# Patient Record
Sex: Female | Born: 1985 | Race: Black or African American | Hispanic: No | Marital: Single | Smoking: Never smoker
Health system: Southern US, Community
[De-identification: ages and names within clinical notes are randomized; demographics above are authoritative.]

## PROBLEM LIST (undated history)

## (undated) DIAGNOSIS — D571 Sickle-cell disease without crisis: Secondary | ICD-10-CM

---

## 2013-12-31 ENCOUNTER — Emergency Department (HOSPITAL_BASED_OUTPATIENT_CLINIC_OR_DEPARTMENT_OTHER)
Admission: EM | Admit: 2013-12-31 | Discharge: 2013-12-31 | Disposition: A | Discharge status: Home / Self Care | Payer: Self-pay | Attending: Emergency Medicine | Admitting: Emergency Medicine

## 2013-12-31 ENCOUNTER — Encounter (HOSPITAL_BASED_OUTPATIENT_CLINIC_OR_DEPARTMENT_OTHER): Payer: Self-pay | Admitting: Emergency Medicine

## 2013-12-31 ENCOUNTER — Emergency Department (HOSPITAL_BASED_OUTPATIENT_CLINIC_OR_DEPARTMENT_OTHER): Payer: Self-pay

## 2013-12-31 DIAGNOSIS — S6990XA Unspecified injury of unspecified wrist, hand and finger(s), initial encounter: Principal | ICD-10-CM | POA: Insufficient documentation

## 2013-12-31 DIAGNOSIS — S6980XA Other specified injuries of unspecified wrist, hand and finger(s), initial encounter: Secondary | ICD-10-CM | POA: Insufficient documentation

## 2013-12-31 DIAGNOSIS — Y929 Unspecified place or not applicable: Secondary | ICD-10-CM | POA: Insufficient documentation

## 2013-12-31 DIAGNOSIS — Y939 Activity, unspecified: Secondary | ICD-10-CM | POA: Insufficient documentation

## 2013-12-31 DIAGNOSIS — W208XXA Other cause of strike by thrown, projected or falling object, initial encounter: Secondary | ICD-10-CM | POA: Insufficient documentation

## 2013-12-31 DIAGNOSIS — Z862 Personal history of diseases of the blood and blood-forming organs and certain disorders involving the immune mechanism: Secondary | ICD-10-CM | POA: Insufficient documentation

## 2013-12-31 DIAGNOSIS — S6991XA Unspecified injury of right wrist, hand and finger(s), initial encounter: Secondary | ICD-10-CM

## 2013-12-31 HISTORY — DX: Sickle-cell disease without crisis: D57.1

## 2013-12-31 NOTE — Discharge Instructions (Signed)
Contusion °A contusion is a deep bruise. Contusions happen when an injury causes bleeding under the skin. Signs of bruising include pain, puffiness (swelling), and discolored skin. The contusion may turn blue, purple, or yellow. °HOME CARE  °· Put ice on the injured area. °¨ Put ice in a plastic bag. °¨ Place a towel between your skin and the bag. °¨ Leave the ice on for 15-20 minutes, 03-04 times a day. °· Only take medicine as told by your doctor. °· Rest the injured area. °· If possible, raise (elevate) the injured area to lessen puffiness. °GET HELP RIGHT AWAY IF:  °· You have more bruising or puffiness. °· You have pain that is getting worse. °· Your puffiness or pain is not helped by medicine. °MAKE SURE YOU:  °· Understand these instructions. °· Will watch your condition. °· Will get help right away if you are not doing well or get worse. °Document Released: 11/17/2007 Document Revised: 08/23/2011 Document Reviewed: 04/05/2011 °Exit[employer]® Patient Information ©2015 [employer]. This information is not intended to replace advice given to you by your health care provider. Make sure you discuss any questions you have with your health care provider. ° °

## 2013-12-31 NOTE — ED Notes (Signed)
Injury to right middle finger that occurred yesterday while trying to retrieve weeds from the lawn mower.

## 2013-12-31 NOTE — ED Notes (Addendum)
Patient states hurt finger on lawnmower yesterday. Nail bed is broken off at tip of finger, patient states bleeding there was stopped after initial injury. Patient feels finger bone may be broken, she has improvised splint in place made from cardboard and string.

## 2013-12-31 NOTE — ED Notes (Signed)
Finger splint applied to right middle finger.  Good circulation, good sensation.  Splint intact but able to move hand.

## 2013-12-31 NOTE — ED Provider Notes (Signed)
CSN: [id number]     Arrival date & time 12/31/13  [id number] History   First MD Initiated Contact with Patient 12/31/13 [phone]     Chief Complaint  Patient presents with  . Finger Injury     (Consider location/radiation/quality/duration/timing/severity/associated sxs/prior Treatment) HPI  28 y.o. Female complaining of injury to right middle finger yesterday from lawn mower falling on it.  The nail was cut off but no laceration to finger.  She states the entire finger hurts. No other injury.  NO numbness, tingling.   Past Medical History  Diagnosis Date  . Sickle cell anemia    Past Surgical History  Procedure Laterality Date  . Cesarean section     No family history on file. History  Substance Use Topics  . Smoking status: Never Smoker   . Smokeless tobacco: Not on file  . Alcohol Use: No   OB History   Grav Para Term Preterm Abortions TAB SAB Ect Mult Living                 Review of Systems  Musculoskeletal: Negative.   Skin: Negative.       Allergies  Review of patient's allergies indicates no known allergies.  Home Medications   Prior to Admission medications   Medication Sig Start Date End Date Taking? Authorizing Provider  ibuprofen (ADVIL,MOTRIN) 800 MG tablet Take 800 mg by mouth every 8 (eight) hours as needed.   Yes Historical Provider, MD   BP 113/81  Pulse 61  Temp(Src) 98.2 F (36.8 C) (Oral)  Resp 14  SpO2 100%  LMP 12/03/2013 Physical Exam  Nursing note and vitals reviewed. Constitutional: She appears well-developed and well-nourished.  HENT:  Head: Normocephalic and atraumatic.  Nose: Nose normal.  Neck: Normal range of motion.  Cardiovascular: Normal rate.   Pulmonary/Chest: Effort normal.  Musculoskeletal:       Right hand: She exhibits decreased range of motion, tenderness and swelling. She exhibits normal two-point discrimination, normal capillary refill, no deformity and no laceration.       Hands:   ED Course  Procedures (including  critical care time) Labs Review Labs Reviewed - No data to display  Imaging Review Dg Finger Middle Right  12/31/2013   CLINICAL DATA:  Blow to the right long finger 1 day ago.  Pain.  EXAM: RIGHT MIDDLE FINGER 2+V  COMPARISON:  None.  FINDINGS: Imaged bones, joints and soft tissues appear normal.  IMPRESSION: Negative exam.   Electronically Signed   By: Drusilla Kannerhomas  Dalessio M.D.   On: 12/31/2013 07:19     EKG Interpretation None      MDM   Final diagnoses:  Finger injury, right, initial encounter    28 y.o. Female with injury to right middle finger- no skin disruption, mild diffuse swelling- radiology study negative for fracture.  Plan splint and referral for recheck to hand surgeon.     Hilario Quarryanielle S Anastasiya Gowin, MD 12/31/13 [phone]

## 2014-01-22 ENCOUNTER — Encounter (HOSPITAL_BASED_OUTPATIENT_CLINIC_OR_DEPARTMENT_OTHER): Payer: Self-pay | Admitting: Emergency Medicine

## 2014-01-22 ENCOUNTER — Emergency Department (HOSPITAL_BASED_OUTPATIENT_CLINIC_OR_DEPARTMENT_OTHER)
Admission: EM | Admit: 2014-01-22 | Discharge: 2014-01-22 | Disposition: A | Discharge status: Home / Self Care | Payer: Medicaid Other | Attending: Emergency Medicine | Admitting: Emergency Medicine

## 2014-01-22 DIAGNOSIS — Z5189 Encounter for other specified aftercare: Secondary | ICD-10-CM | POA: Insufficient documentation

## 2014-01-22 DIAGNOSIS — Z862 Personal history of diseases of the blood and blood-forming organs and certain disorders involving the immune mechanism: Secondary | ICD-10-CM | POA: Insufficient documentation

## 2014-01-22 DIAGNOSIS — S6991XD Unspecified injury of right wrist, hand and finger(s), subsequent encounter: Secondary | ICD-10-CM

## 2014-01-22 NOTE — ED Notes (Addendum)
Pt with finger injury 7/19-seen here 7/20-was to f/u with ortho-did not f/u-requesting RTW note

## 2014-01-22 NOTE — ED Provider Notes (Signed)
CSN: [id number]     Arrival date & time 01/22/14  1639 History   First MD Initiated Contact with Patient 01/22/14 1834     This chart was scribed for Ethelda ChickMartha K Linker, MD by Arlan OrganAshley Leger, ED Scribe. This patient was seen in room MHT13/MHT13 and the patient's care was started 7:05 PM.   Chief Complaint  Patient presents with  . Follow-up   HPI  HPI Comments: Paige AngLatasha Huynh is a 28 y.o. female with a PMHx of sickle cell anemia who presents to the Emergency Department here for follow up today. Pt was seen 7/20 for a finger injury after a lawn mower fell on her R 3rd finger. X-Ray was performed without any abnormal acute findings. Pt was advised to follow with hand surgeon at discharge. States she attempted to see othro, however, at time of visit, she did not have her medicaid card on hand and was unable to be seen. She was given a new appointment 3 weeks out but declined. Pt states pain has now completely subsided. She is requesting a note to be taken off of light duty at work today. No known allergies to medications. No other concerns this visit.  Past Medical History  Diagnosis Date  . Sickle cell anemia    Past Surgical History  Procedure Laterality Date  . Cesarean section     No family history on file. History  Substance Use Topics  . Smoking status: Never Smoker   . Smokeless tobacco: Not on file  . Alcohol Use: No   OB History   Grav Para Term Preterm Abortions TAB SAB Ect Mult Living                 Review of Systems  Constitutional: Negative for fever and chills.  Musculoskeletal: Negative for arthralgias.  Neurological: Negative for weakness and numbness.      Allergies  Review of patient's allergies indicates no known allergies.  Home Medications   Prior to Admission medications   Medication Sig Start Date End Date Taking? Authorizing Provider  ibuprofen (ADVIL,MOTRIN) 800 MG tablet Take 800 mg by mouth every 8 (eight) hours as needed.    Historical Provider, MD    Triage Vitals: BP 115/71  Pulse 51  Temp(Src) 98.1 F (36.7 C) (Oral)  Resp 16  Ht 4\' 11"  (1.499 m)  Wt 105 lb (47.628 kg)  BMI 21.20 kg/m2  SpO2 100%  LMP 01/20/2014   Physical Exam  Nursing note and vitals reviewed. Constitutional: She is oriented to person, place, and time. She appears well-developed and well-nourished.  HENT:  Head: Normocephalic.  Eyes: EOM are normal.  Neck: Normal range of motion.  Pulmonary/Chest: Effort normal.  Abdominal: She exhibits no distension.  Musculoskeletal: Normal range of motion.  Neurological: She is alert and oriented to person, place, and time.  Psychiatric: She has a normal mood and affect.  Note- fingers with no ttp, no pain on ROM, no abrasions or breaks in skin  ED Course  Procedures (including critical care time)  DIAGNOSTIC STUDIES: Oxygen Saturation is 100% on RA, Normal by my interpretation.    COORDINATION OF CARE: 7:06 PM-Discussed treatment plan with pt at bedside and pt agreed to plan.     Labs Review Labs Reviewed - No data to display  Imaging Review No results found.   EKG Interpretation None      MDM   Final diagnoses:  Finger injury, right, subsequent encounter    Pt presenting after resolution of finger  injury, she was not able to get followup, she is requesting return to work note.  Hand/finger exam normal. xrays reviewed from prior and no acute abnormalities.  Discharged with strict return precautions.  Pt agreeable with plan.  Nursing notes including past medical history and social history reviewed and considered in documentation Prior records reviewed and considered during this visit   I personally performed the services described in this documentation, which was scribed in my presence. The recorded information has been reviewed and is accurate.    Ethelda Chick, MD 01/26/14 [phone]

## 2014-01-22 NOTE — ED Notes (Signed)
Patient states she attempted to be seen by the Ortho MD, who could not find her medicaid card and would not see her.

## 2014-01-22 NOTE — Discharge Instructions (Signed)
Return to the ED with any concerns including increased pain, numbness/swelling/discoloration of finger

## 2014-07-16 ENCOUNTER — Emergency Department (HOSPITAL_COMMUNITY)
Admission: EM | Admit: 2014-07-16 | Discharge: 2014-07-16 | Disposition: A | Discharge status: Home / Self Care | Payer: Medicaid Other | Attending: Emergency Medicine | Admitting: Emergency Medicine

## 2014-07-16 ENCOUNTER — Encounter (HOSPITAL_COMMUNITY): Payer: Self-pay | Admitting: Adult Health

## 2014-07-16 ENCOUNTER — Emergency Department (HOSPITAL_COMMUNITY): Payer: Medicaid Other

## 2014-07-16 DIAGNOSIS — R059 Cough, unspecified: Secondary | ICD-10-CM

## 2014-07-16 DIAGNOSIS — J029 Acute pharyngitis, unspecified: Secondary | ICD-10-CM

## 2014-07-16 DIAGNOSIS — J069 Acute upper respiratory infection, unspecified: Secondary | ICD-10-CM | POA: Insufficient documentation

## 2014-07-16 DIAGNOSIS — Z862 Personal history of diseases of the blood and blood-forming organs and certain disorders involving the immune mechanism: Secondary | ICD-10-CM | POA: Insufficient documentation

## 2014-07-16 DIAGNOSIS — R05 Cough: Secondary | ICD-10-CM

## 2014-07-16 LAB — RAPID STREP SCREEN (MED CTR MEBANE ONLY): Streptococcus, Group A Screen (Direct): NEGATIVE

## 2014-07-16 MED ORDER — HYDROCODONE-HOMATROPINE 5-1.5 MG/5ML PO SYRP
5.0000 mL | ORAL_SOLUTION | Freq: Four times a day (QID) | ORAL | Status: AC | PRN
Start: 2014-07-16 — End: ?

## 2014-07-16 NOTE — ED Notes (Signed)
Pt escorted to front entrance.  

## 2014-07-16 NOTE — ED Notes (Signed)
Pt reports fever, sore throat, cough, and body aches for a week. Pt states she has been taking otc medicine with minimum relief. Pt denies any one else in the house being sick. Pt rates pain 7/10 in throat.

## 2014-07-16 NOTE — ED Notes (Signed)
Presents with one week of sore throat and cough and slight fever of 100.6 at home. VSS, throat red, cough producitve with yellow sputum.

## 2014-07-16 NOTE — ED Provider Notes (Signed)
CSN: [id number]     Arrival date & time 07/16/14  1841 History  This chart was scribed for non-physician practitioner, Roxy Horsemanobert Riki Berninger, PA-C working with Gerhard Munchobert Lockwood, MD by Luisa DagoPriscilla Tutu, ED scribe. This patient was seen in room TR05C/TR05C and the patient's care was started at 7:16 PM.    Chief Complaint  Patient presents with  . Sore Throat  . Fever   The history is provided by the patient and medical records. No language interpreter was used.   HPI Comments: Paige Huynh is a 29 y.o. female with a PMhx of sickle cell anemia listed below presents to the Emergency Department with a chief complaint of sudden onset worsening sore throat that started 1 week ago. Pt is also complaining of associated generalized myalgias and cough. Endorses a low grade fever with a measured TMAX of 100.6, current ED temperature is 98.6. She reports taking Tylenol, Ibuprofen, and other OTC medication. Denies any nausea, tobacco usage, emesis, abdominal pain, SOB, weakness, or numbness.   Past Medical History  Diagnosis Date  . Sickle cell anemia    Past Surgical History  Procedure Laterality Date  . Cesarean section     No family history on file. History  Substance Use Topics  . Smoking status: Never Smoker   . Smokeless tobacco: Not on file  . Alcohol Use: No   OB History    No data available     Review of Systems  Constitutional: Positive for fever. Negative for chills.  HENT: Positive for congestion, rhinorrhea and sore throat. Negative for ear pain, sinus pressure, trouble swallowing and voice change.   Eyes: Negative for discharge.  Respiratory: Positive for cough. Negative for shortness of breath, wheezing and stridor.   Cardiovascular: Negative for chest pain.  Gastrointestinal: Negative for abdominal pain.  Genitourinary: Negative.       Allergies  Review of patient's allergies indicates no known allergies.  Home Medications   Prior to Admission medications   Medication Sig  Start Date End Date Taking? Authorizing Provider  ibuprofen (ADVIL,MOTRIN) 800 MG tablet Take 800 mg by mouth every 8 (eight) hours as needed.    Historical Provider, MD   BP 123/90 mmHg  Pulse 77  Temp(Src) 98.6 F (37 C) (Oral)  Resp 18  Ht 4\' 11"  (1.499 m)  Wt 105 lb (47.628 kg)  BMI 21.20 kg/m2  SpO2 100%  Physical Exam  Constitutional: She is oriented to person, place, and time. She appears well-developed and well-nourished. No distress.  HENT:  Head: Normocephalic and atraumatic.  Right Ear: External ear normal.  Left Ear: External ear normal.  Mildly erythematous, no tonsillar exudate, no abscess, no stridor, uvula is midline  TMs clear bilaterally  Eyes: Conjunctivae and EOM are normal. Pupils are equal, round, and reactive to light.  Neck: Normal range of motion. Neck supple. No tracheal deviation present.  Cardiovascular: Normal rate, regular rhythm and normal heart sounds.  Exam reveals no gallop and no friction rub.   No murmur heard. Pulmonary/Chest: Effort normal and breath sounds normal. No stridor. No respiratory distress. She has no wheezes. She has no rales. She exhibits no tenderness.  CTAB  Abdominal: Soft. Bowel sounds are normal. She exhibits no distension. There is no tenderness.  Musculoskeletal: Normal range of motion. She exhibits no tenderness.  Neurological: She is alert and oriented to person, place, and time.  Skin: Skin is warm and dry. No rash noted. She is not diaphoretic.  Psychiatric: She has a normal mood and  affect. Her behavior is normal. Judgment and thought content normal.  Nursing note and vitals reviewed.   ED Course  Procedures (including critical care time)  DIAGNOSTIC STUDIES: Oxygen Saturation is 100% on RA, normal by my interpretation.    COORDINATION OF CARE: 7:20 PM- Pt asked for a work noted, will do as pt requested. Pt advised of plan for treatment and pt agrees.   Imaging Review Dg Chest 2 View  07/16/2014   CLINICAL  DATA:  Acute onset of shortness of breath, cough and fever. Initial encounter.  EXAM: CHEST  2 VIEW  COMPARISON:  None.  FINDINGS: The lungs are well-aerated and clear. There is no evidence of focal opacification, pleural effusion or pneumothorax.  The heart is normal in size; the mediastinal contour is within normal limits. No acute osseous abnormalities are seen.  IMPRESSION: No acute cardiopulmonary process seen.   Electronically Signed   By: Roanna Raider M.D.   On: 07/16/2014 20:14      MDM   Final diagnoses:  Cough  URI (upper respiratory infection)  Viral pharyngitis    Pt CXR negative for acute infiltrate. Patients symptoms are consistent with URI, likely viral etiology. Discussed that antibiotics are not indicated for viral infections. Pt will be discharged with symptomatic treatment.  Verbalizes understanding and is agreeable with plan. Pt is hemodynamically stable & in NAD prior to dc.  Pt afebrile without tonsillar exudate, negative strep. Presents with mild cervical lymphadenopathy, & dysphagia; diagnosis of viral pharyngitis. No abx indicated. DC w symptomatic tx for pain  Pt does not appear dehydrated, but did discuss importance of water rehydration. Presentation non concerning for PTA or infxn spread to soft tissue. No trismus or uvula deviation. Specific return precautions discussed. Pt able to drink water in ED without difficulty with intact air way. Recommended PCP follow up.   I personally performed the services described in this documentation, which was scribed in my presence. The recorded information has been reviewed and is accurate.    Roxy Horseman, PA-C 07/16/14 2128  Gerhard Munch, MD 07/16/14 [phone]

## 2014-07-16 NOTE — Discharge Instructions (Signed)
Upper Respiratory Infection, Adult An upper respiratory infection (URI) is also sometimes known as the common cold. The upper respiratory tract includes the nose, sinuses, throat, trachea, and bronchi. Bronchi are the airways leading to the lungs. Most people improve within 1 week, but symptoms can last up to 2 weeks. A residual cough may last even longer.  CAUSES Many different viruses can infect the tissues lining the upper respiratory tract. The tissues become irritated and inflamed and often become very moist. Mucus production is also common. A cold is contagious. You can easily spread the virus to others by oral contact. This includes kissing, sharing a glass, coughing, or sneezing. Touching your mouth or nose and then touching a surface, which is then touched by another person, can also spread the virus. SYMPTOMS  Symptoms typically develop 1 to 3 days after you come in contact with a cold virus. Symptoms vary from person to person. They may include:  Runny nose.  Sneezing.  Nasal congestion.  Sinus irritation.  Sore throat.  Loss of voice (laryngitis).  Cough.  Fatigue.  Muscle aches.  Loss of appetite.  Headache.  Low-grade fever. DIAGNOSIS  You might diagnose your own cold based on familiar symptoms, since most people get a cold 2 to 3 times a year. Your caregiver can confirm this based on your exam. Most importantly, your caregiver can check that your symptoms are not due to another disease such as strep throat, sinusitis, pneumonia, asthma, or epiglottitis. Blood tests, throat tests, and X-rays are not necessary to diagnose a common cold, but they may sometimes be helpful in excluding other more serious diseases. Your caregiver will decide if any further tests are required. RISKS AND COMPLICATIONS  You may be at risk for a more severe case of the common cold if you smoke cigarettes, have chronic heart disease (such as heart failure) or lung disease (such as asthma), or if  you have a weakened immune system. The very young and very old are also at risk for more serious infections. Bacterial sinusitis, middle ear infections, and bacterial pneumonia can complicate the common cold. The common cold can worsen asthma and chronic obstructive pulmonary disease (COPD). Sometimes, these complications can require emergency medical care and may be life-threatening. PREVENTION  The best way to protect against getting a cold is to practice good hygiene. Avoid oral or hand contact with people with cold symptoms. Wash your hands often if contact occurs. There is no clear evidence that vitamin C, vitamin E, echinacea, or exercise reduces the chance of developing a cold. However, it is always recommended to get plenty of rest and practice good nutrition. TREATMENT  Treatment is directed at relieving symptoms. There is no cure. Antibiotics are not effective, because the infection is caused by a virus, not by bacteria. Treatment may include:  Increased fluid intake. Sports drinks offer valuable electrolytes, sugars, and fluids.  Breathing heated mist or steam (vaporizer or shower).  Eating chicken soup or other clear broths, and maintaining good nutrition.  Getting plenty of rest.  Using gargles or lozenges for comfort.  Controlling fevers with ibuprofen or acetaminophen as directed by your caregiver.  Increasing usage of your inhaler if you have asthma. Zinc gel and zinc lozenges, taken in the first 24 hours of the common cold, can shorten the duration and lessen the severity of symptoms. Pain medicines may help with fever, muscle aches, and throat pain. A variety of non-prescription medicines are available to treat congestion and runny nose. Your caregiver   can make recommendations and may suggest nasal or lung inhalers for other symptoms.  HOME CARE INSTRUCTIONS   Only take over-the-counter or prescription medicines for pain, discomfort, or fever as directed by your  caregiver.  Use a warm mist humidifier or inhale steam from a shower to increase air moisture. This may keep secretions moist and make it easier to breathe.  Drink enough water and fluids to keep your urine clear or pale yellow.  Rest as needed.  Return to work when your temperature has returned to normal or as your caregiver advises. You may need to stay home longer to avoid infecting others. You can also use a face mask and careful hand washing to prevent spread of the virus. SEEK MEDICAL CARE IF:   After the first few days, you feel you are getting worse rather than better.  You need your caregiver's advice about medicines to control symptoms.  You develop chills, worsening shortness of breath, or brown or red sputum. These may be signs of pneumonia.  You develop yellow or brown nasal discharge or pain in the face, especially when you bend forward. These may be signs of sinusitis.  You develop a fever, swollen neck glands, pain with swallowing, or white areas in the back of your throat. These may be signs of strep throat. SEEK IMMEDIATE MEDICAL CARE IF:   You have a fever.  You develop severe or persistent headache, ear pain, sinus pain, or chest pain.  You develop wheezing, a prolonged cough, cough up blood, or have a change in your usual mucus (if you have chronic lung disease).  You develop sore muscles or a stiff neck. Document Released: 11/24/2000 Document Revised: 08/23/2011 Document Reviewed: 09/05/2013 [employer] Patient Information 2015 [employer]. This information is not intended to replace advice given to you by your health care provider. Make sure you discuss any questions you have with your health care provider.  

## 2014-07-18 LAB — CULTURE, GROUP A STREP

## 2016-06-05 IMAGING — CR DG CHEST 2V
2 series · 2 of 2 positions shown · non-contrast
Comparison: None.

CLINICAL DATA: Acute onset of shortness of breath, cough and fever.
Initial encounter.

EXAM:
CHEST  2 VIEW

[w chest pa]
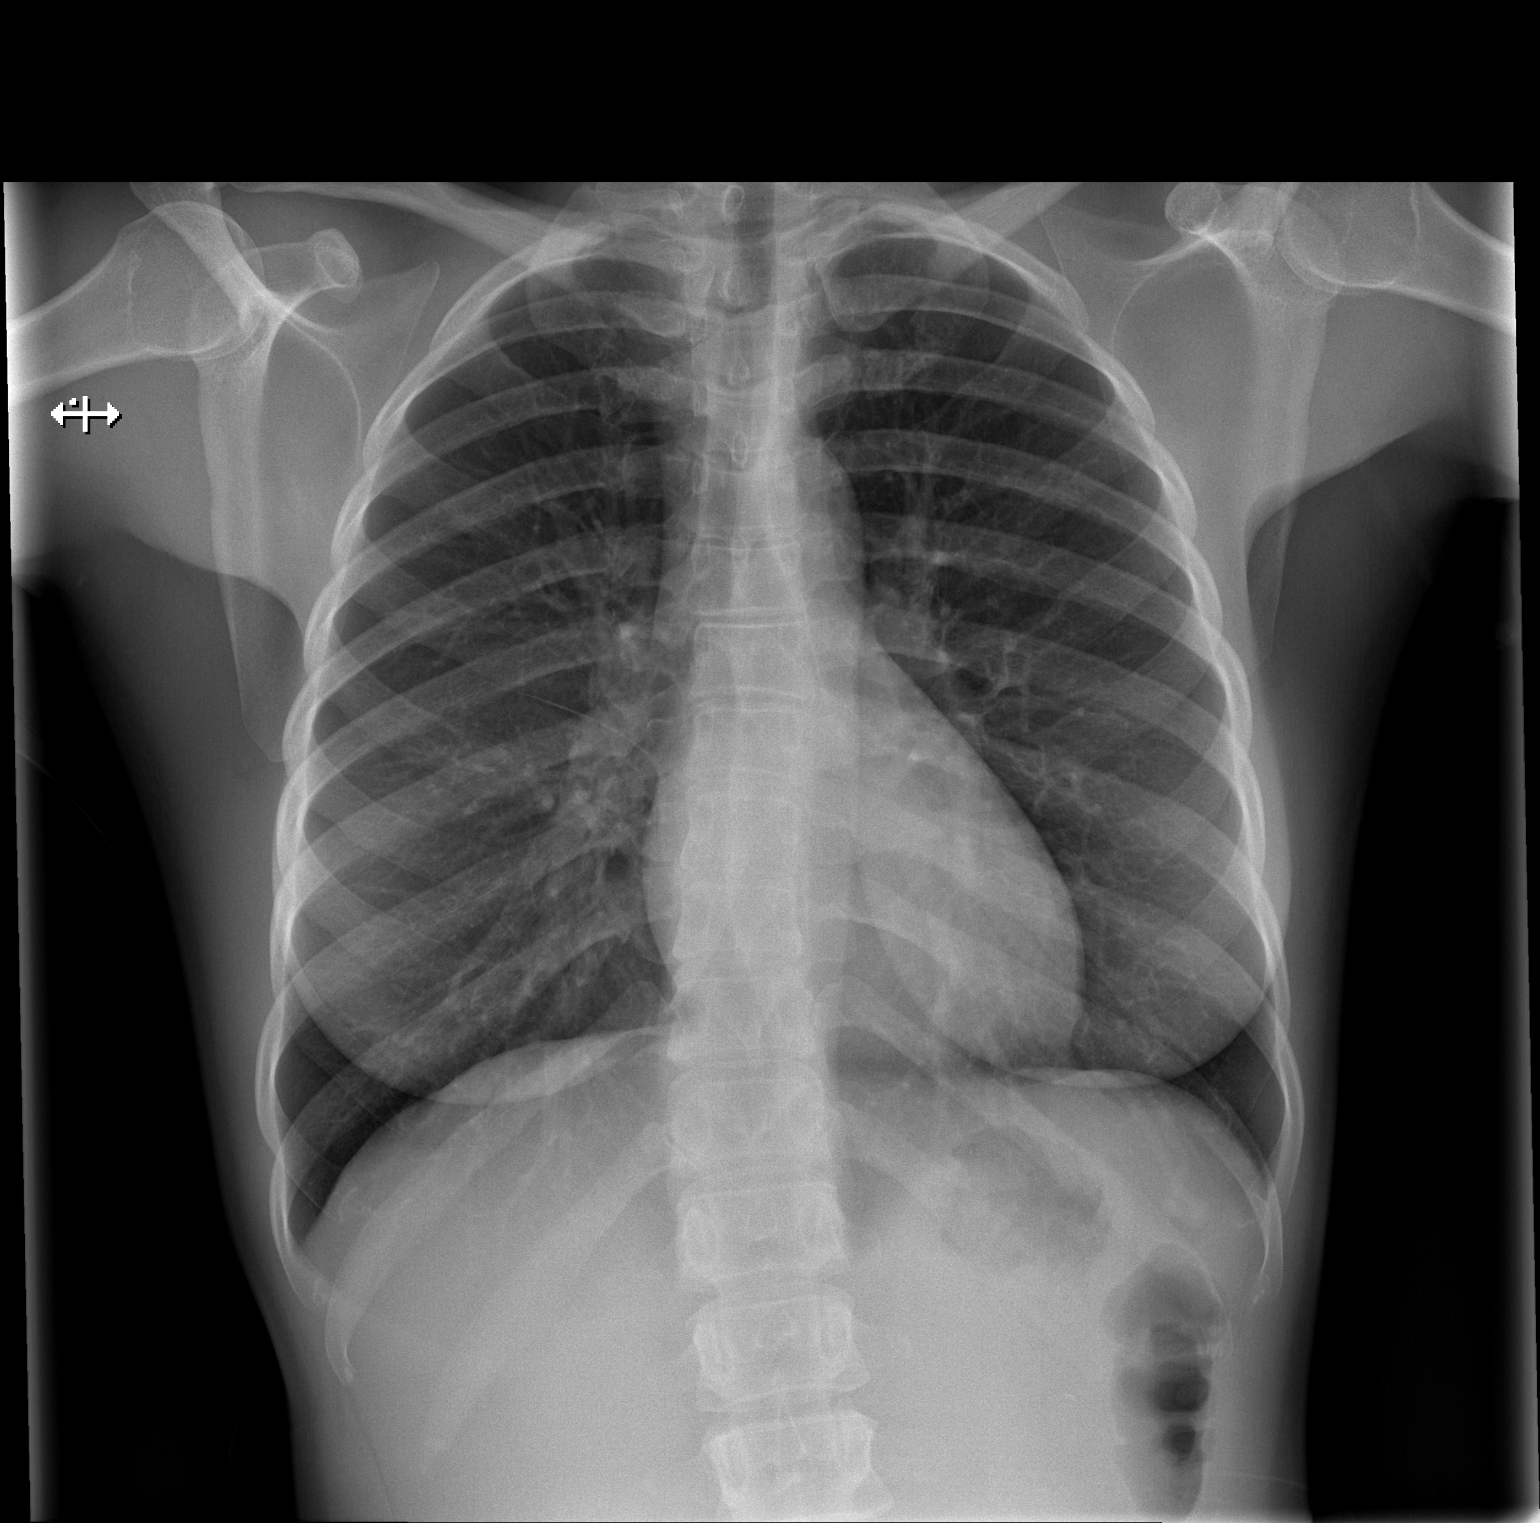

[w chest lat]
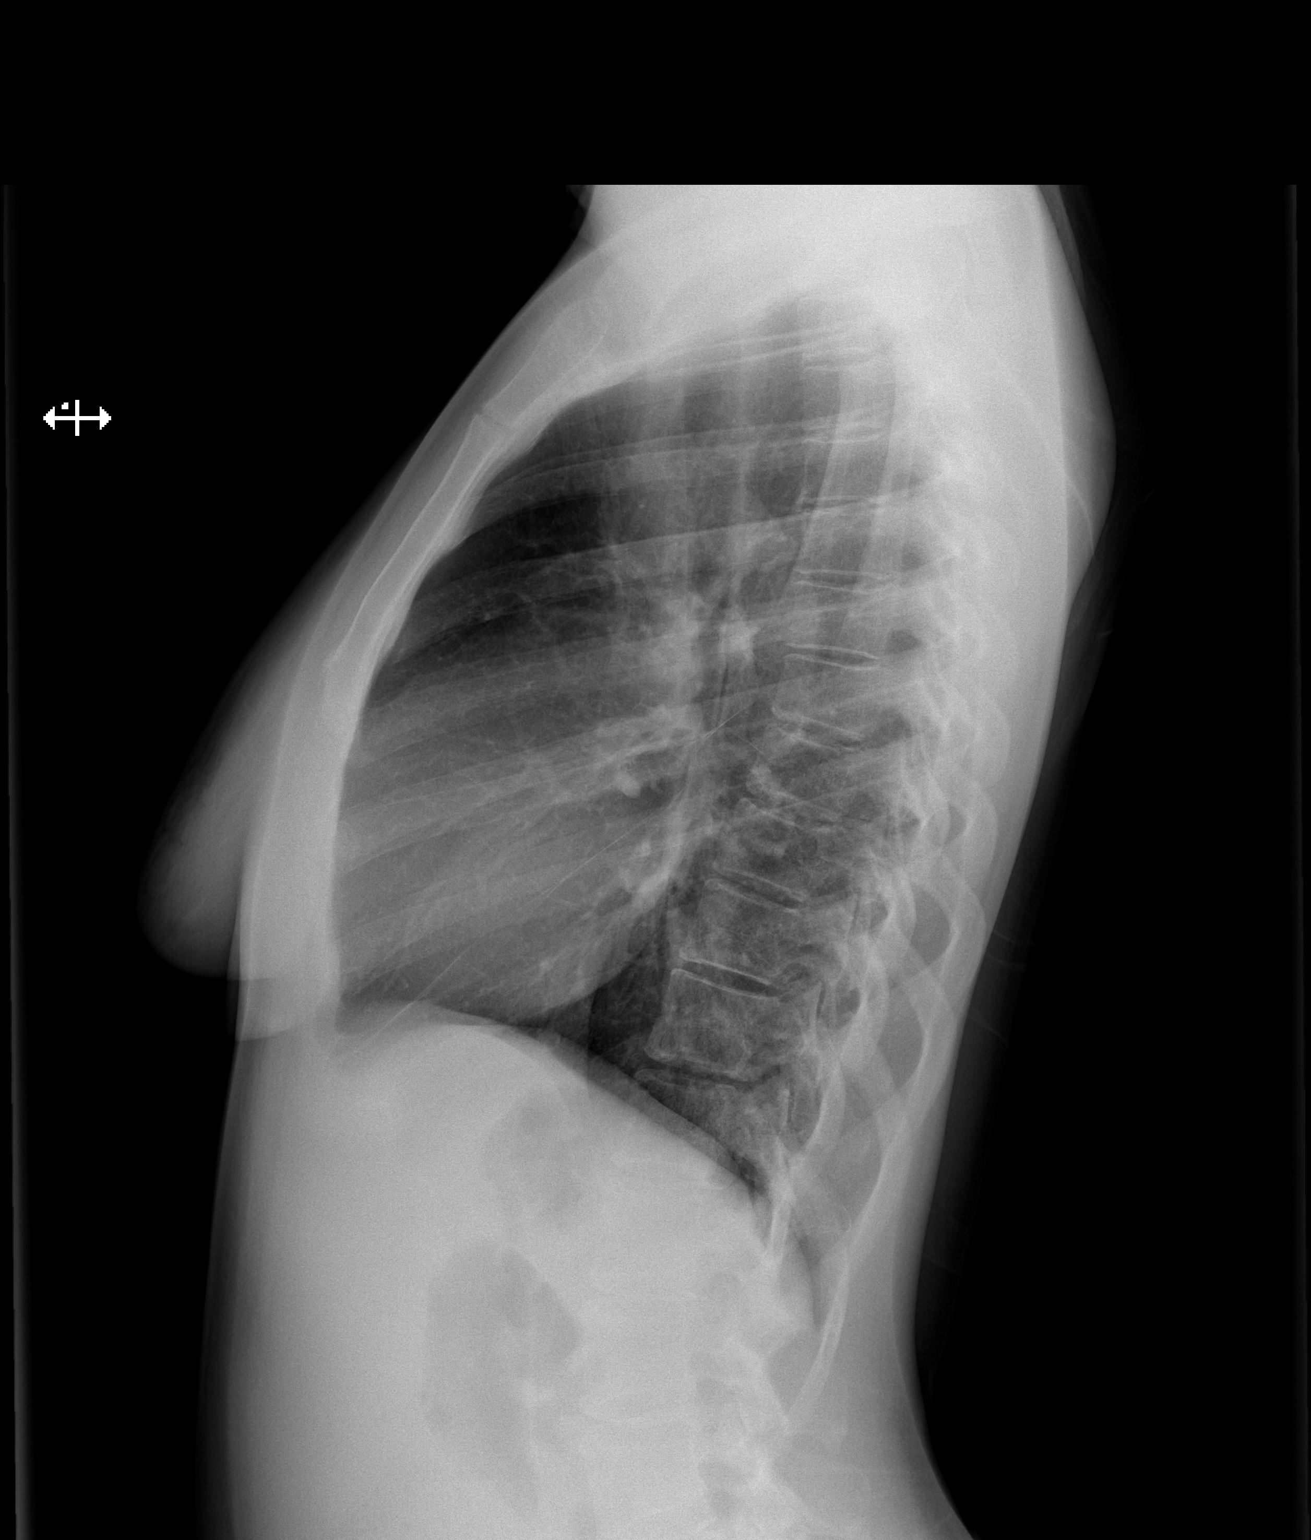

[2 of 2 positions shown; findings below may reference images not displayed]

FINDINGS: The lungs are well-aerated and clear. There is no evidence of focal
opacification, pleural effusion or pneumothorax.

The heart is normal in size; the mediastinal contour is within
normal limits. No acute osseous abnormalities are seen.
IMPRESSION: No acute cardiopulmonary process seen.
